# Patient Record
Sex: Female | Born: 1985 | Race: Black or African American | Hispanic: No | Marital: Single | Smoking: Never smoker
Health system: Southern US, Community
[De-identification: ages and names within clinical notes are randomized; demographics above are authoritative.]

---
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
## 2006-07-11 ENCOUNTER — Ambulatory Visit (HOSPITAL_COMMUNITY): Admission: RE | Admit: 2006-07-11 | Discharge: 2006-07-11 | Payer: Self-pay | Admitting: Unknown Physician Specialty

## 2006-08-08 ENCOUNTER — Ambulatory Visit (HOSPITAL_COMMUNITY): Admission: RE | Admit: 2006-08-08 | Discharge: 2006-08-08 | Payer: Self-pay | Admitting: Unknown Physician Specialty

## 2006-08-31 ENCOUNTER — Observation Stay (HOSPITAL_COMMUNITY): Admission: RE | Admit: 2006-08-31 | Discharge: 2006-08-31 | Payer: Self-pay | Admitting: Obstetrics and Gynecology

## 2006-09-05 ENCOUNTER — Ambulatory Visit (HOSPITAL_COMMUNITY): Admission: RE | Admit: 2006-09-05 | Discharge: 2006-09-05 | Payer: Self-pay | Admitting: Unknown Physician Specialty

## 2006-10-17 ENCOUNTER — Ambulatory Visit (HOSPITAL_COMMUNITY): Admission: RE | Admit: 2006-10-17 | Discharge: 2006-10-17 | Payer: Self-pay | Admitting: Unknown Physician Specialty

## 2007-07-23 IMAGING — US US OB FOLLOW-UP
1 series · 14 of 28 positions shown · non-contrast
Comparison: none

OBSTETRICAL ULTRASOUND:
 This ultrasound was performed in The [HOSPITAL], and the [facility] report will be stored to [REDACTED] PACS.

[Series 1: us ob follow-up · 14 of 37 slices shown]
[im 2/37]
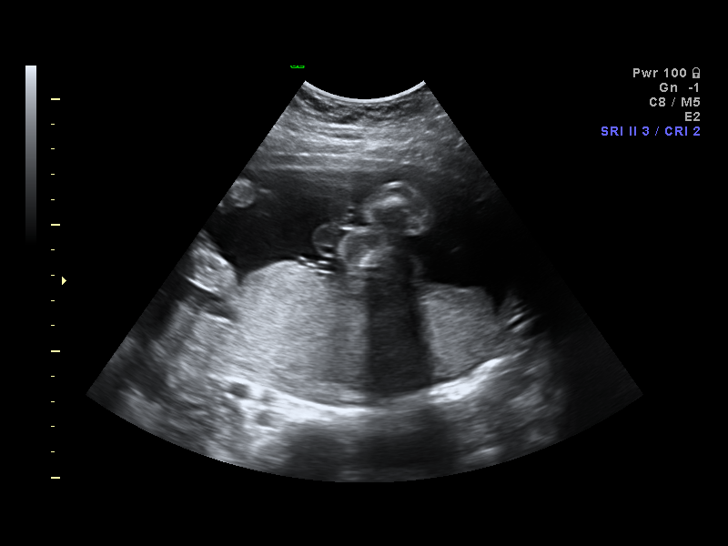
[im 5/37]
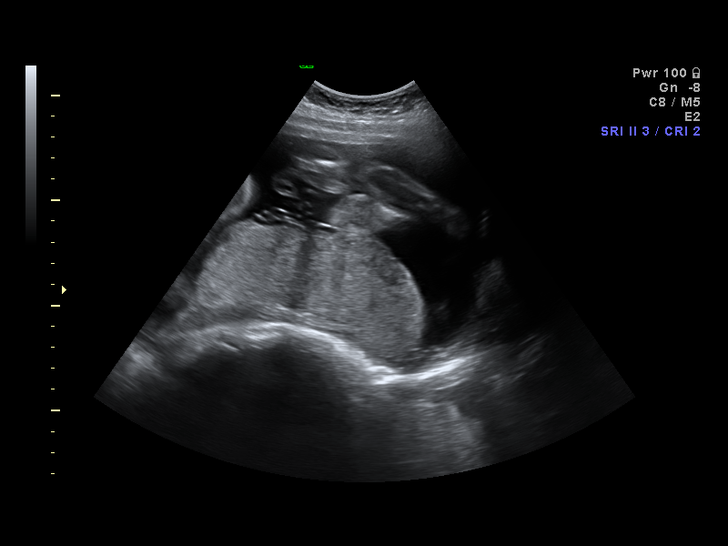
[im 7/37]
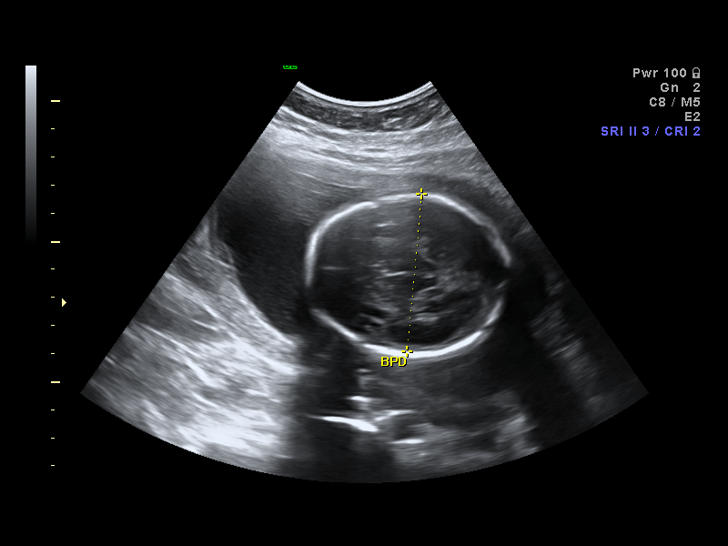
[im 10/37]
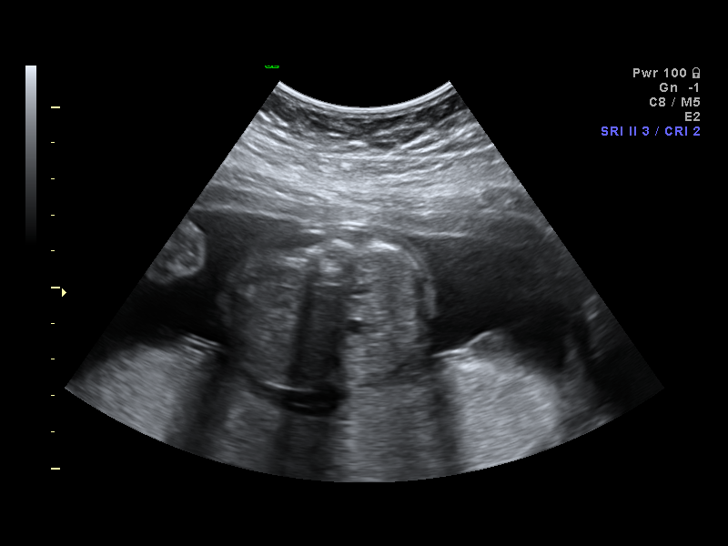
[im 13/37]
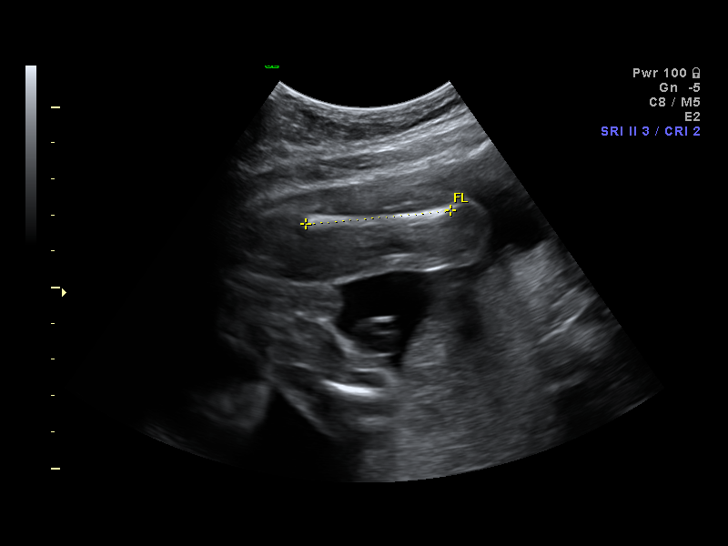
[im 15/37]
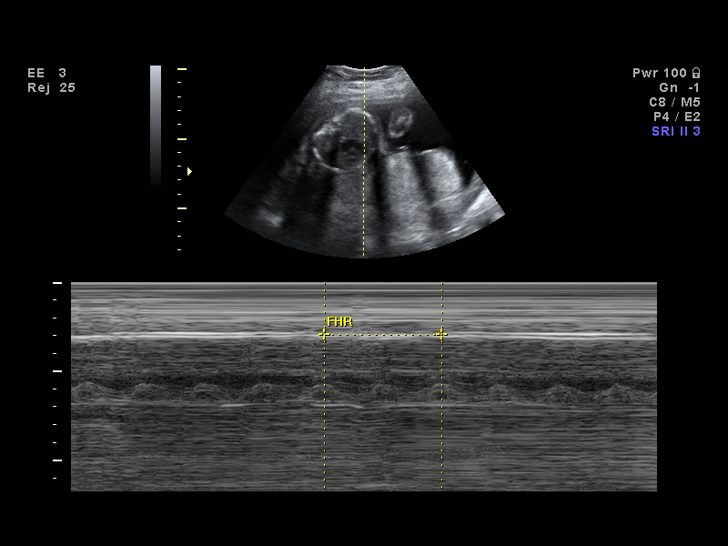
[im 18/37]
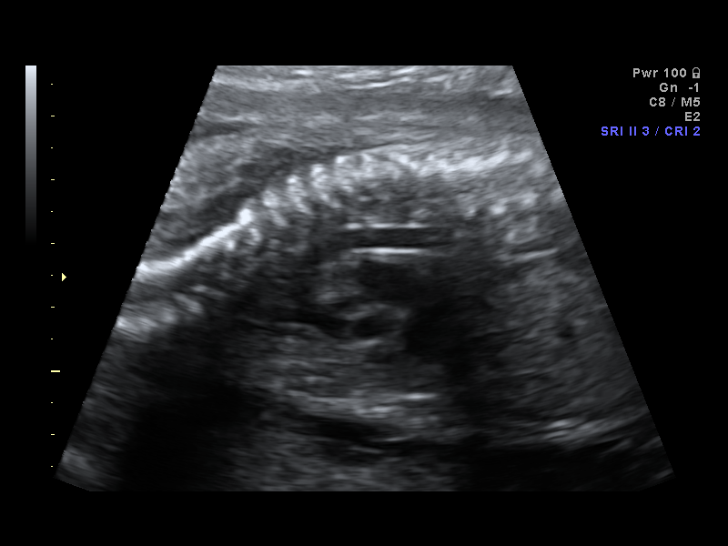
[im 21/37]
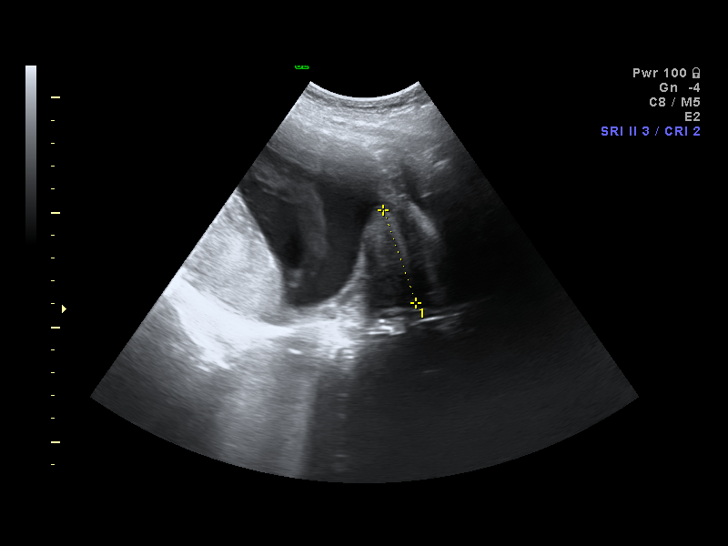
[im 23/37]
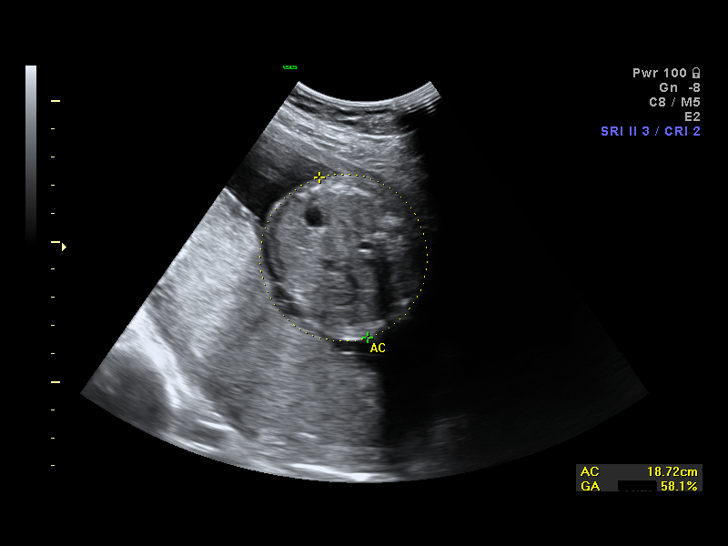
[im 26/37]
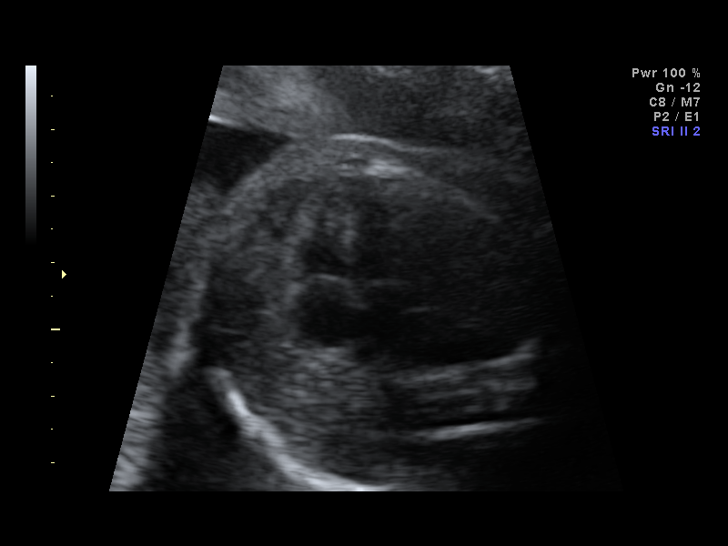
[im 29/37]
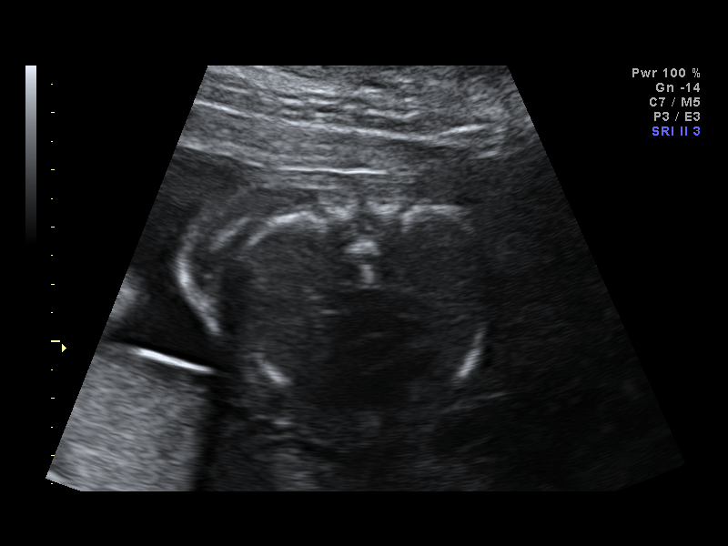
[im 31/37]
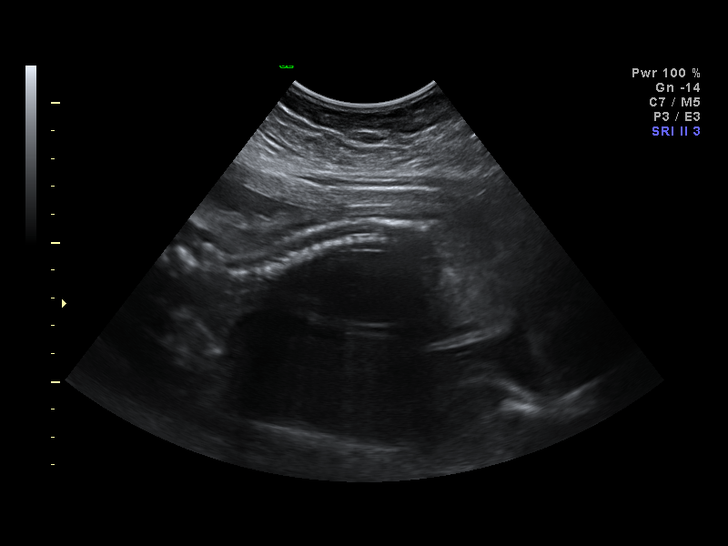
[im 34/37]
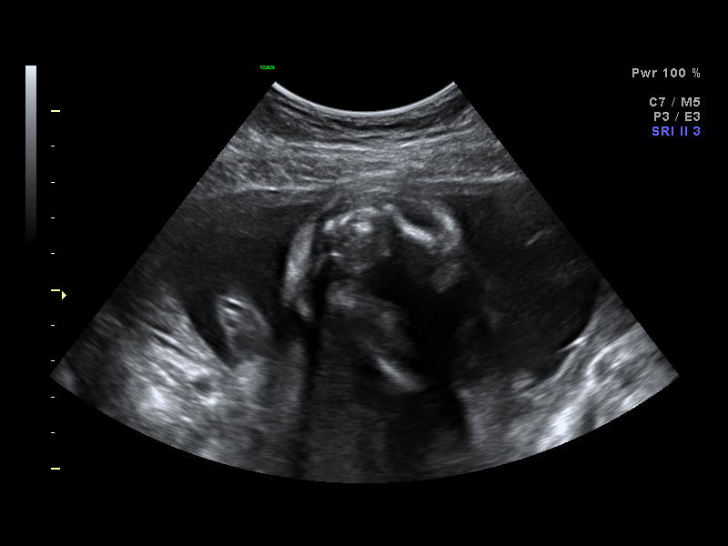
[im 37/37]
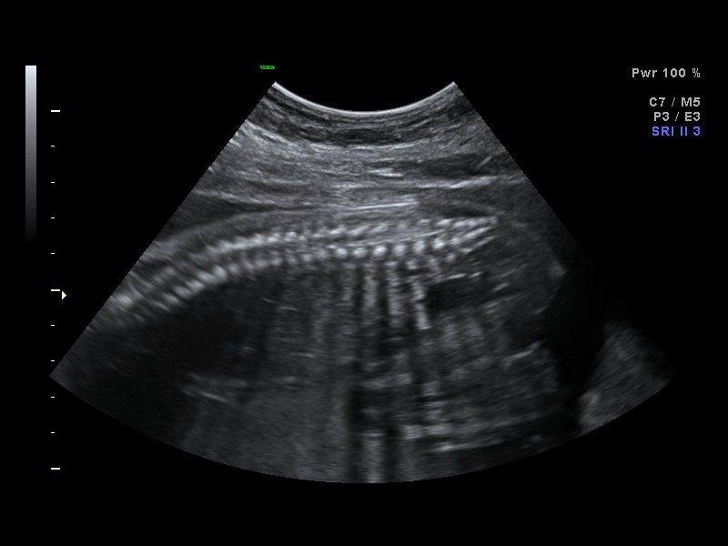

[14 of 28 positions shown; findings below may reference images not displayed]

IMPRESSION: The [facility] report has also been faxed to the ordering physician.

## 2009-01-31 ENCOUNTER — Emergency Department (HOSPITAL_COMMUNITY): Admission: EM | Admit: 2009-01-31 | Discharge: 2009-01-31 | Payer: Self-pay | Admitting: Emergency Medicine

## 2009-08-01 ENCOUNTER — Emergency Department (HOSPITAL_COMMUNITY): Admission: EM | Admit: 2009-08-01 | Discharge: 2009-08-01 | Payer: Self-pay | Admitting: Emergency Medicine

## 2010-07-21 LAB — POCT I-STAT, CHEM 8
BUN: 7 mg/dL (ref 6–23)
Calcium, Ion: 1.1 mmol/L — ABNORMAL LOW (ref 1.12–1.32)
Chloride: 107 meq/L (ref 96–112)
Creatinine, Ser: 0.8 mg/dL (ref 0.4–1.2)
Glucose, Bld: 90 mg/dL (ref 70–99)
HCT: 34 % — ABNORMAL LOW (ref 36.0–46.0)
Hemoglobin: 11.6 g/dL — ABNORMAL LOW (ref 12.0–15.0)
Potassium: 3.6 mEq/L (ref 3.5–5.1)
Sodium: 137 mEq/L (ref 135–145)
TCO2: 19 mmol/L (ref 0–100)

## 2010-07-21 LAB — PREGNANCY, URINE: Preg Test, Ur: POSITIVE

## 2010-07-21 LAB — HCG, QUANTITATIVE, PREGNANCY: hCG, Beta Chain, Quant, S: 216072 m[IU]/mL — ABNORMAL HIGH (ref <5)

## 2010-08-30 NOTE — Group Therapy Note (Signed)
NAMECAYLIN, NASS            ACCOUNT NO.:  [id number]   MEDICAL RECORD NO.:  [id number]          PATIENT TYPE:  OIB   LOCATION:  A415                          FACILITY:  [facility]   PHYSICIAN:  Lazaro Arms, M.D.   DATE OF BIRTH:  03-24-1986   DATE OF PROCEDURE:  08/31/2006  DATE OF DISCHARGE:                                 PROGRESS NOTE   SUBJECTIVE:  Sydney Chen is about [redacted] weeks pregnant with her first baby.  She is a patient of Dr. Ralph Dowdy and Dr. Mora Appl in [place].  She came in with  complaints of having seen some blood when she wiped this morning after  using the bathroom and she is having some mild contractions every 3-5  minutes that she does not feel.  She does have positive Trichomonas with  a very friable cervix.  Her cervix is long, thick, closed. At this  point, we will just treat her for her infection and keep an eye on her.  Recheck her cervix is 3-4 hours and just see where it goes.      Jacklyn Shell, C.N.M.      Lazaro Arms, M.D.  Electronically Signed    FC/MEDQ  D:  08/31/2006  T:  08/31/2006  Job:  [id number]   cc:   [facility]   Ernestina Penna  Fax: [phone]   Mora Appl, M.D.

## 2013-10-12 ENCOUNTER — Encounter (HOSPITAL_COMMUNITY): Payer: Self-pay | Admitting: Emergency Medicine

## 2013-10-12 ENCOUNTER — Emergency Department (HOSPITAL_COMMUNITY)
Admission: EM | Admit: 2013-10-12 | Discharge: 2013-10-12 | Disposition: A | Discharge status: Home / Self Care | Payer: BC Managed Care – HMO | Attending: Emergency Medicine | Admitting: Emergency Medicine

## 2013-10-12 DIAGNOSIS — K047 Periapical abscess without sinus: Secondary | ICD-10-CM | POA: Diagnosis not present

## 2013-10-12 DIAGNOSIS — K0889 Other specified disorders of teeth and supporting structures: Secondary | ICD-10-CM | POA: Diagnosis present

## 2013-10-12 DIAGNOSIS — Z792 Long term (current) use of antibiotics: Secondary | ICD-10-CM | POA: Diagnosis not present

## 2013-10-12 MED ORDER — HYDROCODONE-ACETAMINOPHEN 5-325 MG PO TABS: 1.0000 | ORAL_TABLET | ORAL | Status: AC | PRN

## 2013-10-12 MED ORDER — AMOXICILLIN 500 MG PO CAPS: 500.0000 mg | ORAL_CAPSULE | Freq: Three times a day (TID) | ORAL | Status: AC

## 2013-10-12 NOTE — ED Notes (Signed)
Left sided dental pain with swelling to left lower side of face.

## 2013-10-12 NOTE — Discharge Instructions (Signed)
Abscessed Tooth An abscessed tooth is an infection around your tooth. It may be caused by holes or damage to the tooth (cavity) or a dental disease. An abscessed tooth causes mild to very bad pain in and around the tooth. See your dentist right away if you have tooth or gum pain. HOME CARE  Take your medicine as told. Finish it even if you start to feel better.  Do not drive after taking pain medicine.  Rinse your mouth (gargle) often with salt water ( teaspoon salt in 8 ounces of warm water).  Do not apply heat to the outside of your face. GET HELP RIGHT AWAY IF:   You have a temperature by mouth above 102 F (38.9 C), not controlled by medicine.  You have chills and a very bad headache.  You have problems breathing or swallowing.  Your mouth will not open.  You develop puffiness (swelling) on the neck or around the eye.  Your pain is not helped by medicine.  Your pain is getting worse instead of better. MAKE SURE YOU:   Understand these instructions.  Will watch your condition.  Will get help right away if you are not doing well or get worse. Document Released: 09/20/2007 Document Revised: 06/26/2011 Document Reviewed: 07/12/2010 [facility] Patient Information 2015 [employer] [place]. This information is not intended to replace advice given to you by your health care provider. Make sure you discuss any questions you have with your health care provider.   Complete your entire course of antibiotics as prescribed.  You  may use the hydrocodone for pain relief but do not drive within 4 hours of taking as this will make you drowsy.  Avoid applying heat or ice to this abscess area which can worsen your symptoms.  You may use warm salt water swish and spit treatment or half peroxide and water swish and spit after meals to keep this area clean as discussed.  Call Dr. Raford PitcherBarrett for further management of your symptoms.

## 2013-10-12 NOTE — ED Provider Notes (Signed)
CSN: [id number]     Arrival date & time 10/12/13  1521 History  This chart was scribed for Sydney AmorJulie Idol, PA-C, non-physician practitioner working with Flint MelterElliott L Wentz, MD by Nicholos Johnsenise Iheanachor, ED scribe. This patient was seen in room APFT23/APFT23 and the patient's care was started at 3:59 PM.     Chief Complaint  Patient presents with  . Dental Pain   The history is provided by the patient. No language interpreter was used.   HPI Comments: Blase MessShaquana T Chen is a 28 y.o. female who presents to the Emergency Department complaining of left lower dental pain w/ associated swelling; onset last night. States the area of pain has had a broken tooth to the gumline for a while but pain has not been present until last night. Took Aleve last night for pain with minimal relief. Been using Orajel today that only relieves pain for a few minutes. Has not had wisdom teeth removed. Followed regularly by Dr. Raford PitcherBarrett and she has arranged to see him next week. 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  History reviewed. No pertinent past medical history. History reviewed. No pertinent past surgical history. No family history on file. History  Substance Use Topics  . Smoking status: Never Smoker   . Smokeless tobacco: Not on file  . Alcohol Use: Yes     Comment: occ   OB History   Grav Para Term Preterm Abortions TAB SAB Ect Mult Living                 Review of Systems  Constitutional: Negative for fever.  HENT: Positive for dental problem and facial swelling. Negative for sore throat.   Respiratory: Negative for shortness of breath.   Musculoskeletal: Negative for neck pain and neck stiffness.   Allergies  Review of patient's allergies indicates no known allergies.  Home Medications   Prior to Admission medications   Medication Sig Start Date End Date Taking? Authorizing Patrici Minnis  ibuprofen (ADVIL,MOTRIN) 200 MG tablet Take 400 mg by mouth once as needed for mild pain  or moderate pain.   Yes Historical Jovane Foutz, MD  amoxicillin (AMOXIL) 500 MG capsule Take 1 capsule (500 mg total) by mouth 3 (three) times daily. 10/12/13 10/22/13  Sydney AmorJulie Idol, PA-C  HYDROcodone-acetaminophen (NORCO/VICODIN) 5-325 MG per tablet Take 1 tablet by mouth every 4 (four) hours as needed. 10/12/13   Sydney AmorJulie Idol, PA-C   Triage vitals: BP 135/90  Pulse 78  Temp(Src) 97.6 F (36.4 C) (Oral)  Resp 18  Ht 5\' 6"  (1.676 m)  Wt 190 lb (86.183 kg)  BMI 30.68 kg/m2  SpO2 99%  Physical Exam  Nursing note and vitals reviewed. Constitutional: She is oriented to person, place, and time. She appears well-developed and well-nourished. No distress.  HENT:  Head: Normocephalic and atraumatic.  Right Ear: Tympanic membrane and external ear normal.  Left Ear: Tympanic membrane and external ear normal.  Mouth/Throat: Oropharynx is clear and moist and mucous membranes are normal. No oral lesions. No trismus in the jaw. No dental abscesses.  Left 1st molar is decayed and fractured to the gingival line. Mild gingival erythema without edema or fluctuance. Mild induration along outer jaw line with no fluctuance.   Eyes: Conjunctivae are normal.  Neck: Normal range of motion. Neck supple.  Cardiovascular: Normal rate and normal heart sounds.   No murmur heard. Lymphadenopathy:    She has no cervical adenopathy.  Neurological: She is alert and  oriented to person, place, and time.  Skin: Skin is warm and dry. No erythema.    ED Course  Procedures (including critical care time) DIAGNOSTIC STUDIES: Oxygen Saturation is 99% on room air, normal by my interpretation.    COORDINATION OF CARE: At 4:01 PM: Discussed treatment plan with patient which includes antibiotics and pain medication. Pt advised to follow up with her dentist. Patient agrees.    Labs Review Labs Reviewed - No data to display  Imaging Review No results found.   EKG Interpretation None      MDM   Final diagnoses:  Dental  abscess   Pt prescribed hydrocodone, amoxil.  Encouraged f/u with dentist as planned. The patient appears reasonably screened and/or stabilized for discharge and I doubt any other medical condition or other [facility] requiring further screening, evaluation, or treatment in the ED at this time prior to discharge.  I personally performed the services described in this documentation, which was scribed in my presence. The recorded information has been reviewed and is accurate.     Sydney AmorJulie Idol, PA-C 10/14/13 1240

## 2013-10-15 NOTE — ED Provider Notes (Signed)
Medical screening examination/treatment/procedure(s) were performed by non-physician practitioner and as supervising physician I was immediately available for consultation/collaboration.  Iman Orourke L Cyera Balboni, MD 10/15/13 1559 

## 2020-02-27 ENCOUNTER — Ambulatory Visit
Admission: RE | Admit: 2020-02-27 | Discharge: 2020-02-27 | Disposition: A | Discharge status: Home / Self Care | Payer: PRIVATE HEALTH INSURANCE | Source: Ambulatory Visit | Attending: Emergency Medicine | Admitting: Emergency Medicine

## 2020-02-27 ENCOUNTER — Other Ambulatory Visit: Payer: Self-pay

## 2020-02-27 VITALS — BP 128/80 | HR 90 | Temp 98.5°F | Resp 19 | Ht 66.0 in | Wt 180.0 lb

## 2020-02-27 DIAGNOSIS — G43809 Other migraine, not intractable, without status migrainosus: Secondary | ICD-10-CM

## 2020-02-27 MED ORDER — KETOROLAC TROMETHAMINE 30 MG/ML IJ SOLN
30.0000 mg | Freq: Once | INTRAMUSCULAR | Status: AC
  Administered 2020-02-27: 30 mg via INTRAMUSCULAR

## 2020-02-27 MED ORDER — DEXAMETHASONE SODIUM PHOSPHATE 10 MG/ML IJ SOLN
10.0000 mg | Freq: Once | INTRAMUSCULAR | Status: AC
  Administered 2020-02-27: 10 mg via INTRAMUSCULAR

## 2020-02-27 NOTE — ED Triage Notes (Signed)
Headache  x  3 days.  Pt states she works from home but was unable to work today.  Hx of headaches. Has tried Excedrin with no relief.

## 2020-02-27 NOTE — ED Provider Notes (Signed)
[facility]   [id number] 02/27/20 Arrival Time: 1141  [id number]  SUBJECTIVE:  Sydney Chen is a 34 y.o. female who complains of migraine for 3 days.  Denies a precipitating event, or recent head trauma.  Patient localizes her pain to the temples and forehead.  Describes the pain as constant and throbbing in character.  Patient has tried OTC excedrin without relief. Symptoms are made worse with light and wearing a hat.  Reports similar symptoms in the past.  This is not the worst headache of their life.  Patient denies fever, chills, nausea, vomiting, rhinorrhea, watery eyes, chest pain, SOB, abdominal pain, weakness, numbness or tingling, slurred speech.     ROS: As per HPI.  All other pertinent ROS negative.     History reviewed. No pertinent past medical history. History reviewed. No pertinent surgical history. No Known Allergies No current facility-administered medications on file prior to encounter.   Current Outpatient Medications on File Prior to Encounter  Medication Sig Dispense Refill  . HYDROcodone-acetaminophen (NORCO/VICODIN) 5-325 MG per tablet Take 1 tablet by mouth every 4 (four) hours as needed. 15 tablet 0  . ibuprofen (ADVIL,MOTRIN) 200 MG tablet Take 400 mg by mouth once as needed for mild pain or moderate pain.     Social History   Socioeconomic History  . Marital status: Single    Spouse name: Not on file  . Number of children: Not on file  . Years of education: Not on file  . Highest education level: Not on file  Occupational History  . Not on file  Tobacco Use  . Smoking status: Never Smoker  . Smokeless tobacco: Never Used  Substance and Sexual Activity  . Alcohol use: Yes    Comment: occ  . Drug use: No  . Sexual activity: Yes    Birth control/protection: Implant  Other Topics Concern  . Not on file  Social History Narrative  . Not on file   Social Determinants of Health   Financial Resource Strain:   . Difficulty of  Paying Living Expenses: Not on file  Food Insecurity:   . Worried About Programme researcher, broadcasting/film/video in the Last Year: Not on file  . Ran Out of Food in the Last Year: Not on file  Transportation Needs:   . Lack of Transportation (Medical): Not on file  . Lack of Transportation (Non-Medical): Not on file  Physical Activity:   . Days of Exercise per Week: Not on file  . Minutes of Exercise per Session: Not on file  Stress:   . Feeling of Stress : Not on file  Social Connections:   . Frequency of Communication with Friends and Family: Not on file  . Frequency of Social Gatherings with Friends and Family: Not on file  . Attends Religious Services: Not on file  . Active Member of Clubs or Organizations: Not on file  . Attends Banker Meetings: Not on file  . Marital Status: Not on file  Intimate Partner Violence:   . Fear of Current or Ex-Partner: Not on file  . Emotionally Abused: Not on file  . Physically Abused: Not on file  . Sexually Abused: Not on file   No family history on file.  OBJECTIVE:  Vitals:   02/27/20 1159 02/27/20 1200  BP: 128/80   Pulse: 90   Resp: 19   Temp: 98.5 F (36.9 C)   TempSrc: Oral   SpO2: 98%   Weight:  180 lb (81.6 kg)  Height:  5\' 6"  (1.676 m)    General appearance: alert; no distress Eyes: PERRLA; EOMI HENT: normocephalic; atraumatic Neck: supple with FROM Lungs: clear to auscultation bilaterally Heart: regular rate and rhythm.   Extremities: no edema; symmetrical with no gross deformities Skin: warm and dry Neurologic: CN 2-12 grossly intact; finger to nose without difficulty; normal gait; strength and sensation intact bilaterally about the upper and lower extremities; negative pronator drift Psychological: alert and cooperative; normal mood and affect   ASSESSMENT & PLAN:  1. Other migraine without status migrainosus, not intractable     Meds ordered this encounter  Medications  . ketorolac (TORADOL) 30 MG/ML injection 30  mg  . dexamethasone (DECADRON) injection 10 mg    Migraine cocktail given in office Rest and drink plenty of fluids Use OTC medications as needed for symptomatic relief Follow up with PCP if symptoms persists Return or go to the ER if you have any new or worsening symptoms such as fever, chills, nausea, vomiting, chest pain, shortness of breath, cough, vision changes, worsening headache despite treatment, slurred speech, facial asymmetry, weakness in arms or legs, etc...  Reviewed expectations re: course of current medical issues. Questions answered. Outlined signs and symptoms indicating need for more acute intervention. Patient verbalized understanding. After Visit Summary given.   , PA-C 02/27/20 1215

## 2020-02-27 NOTE — Discharge Instructions (Signed)
Migraine cocktail given in office Rest and drink plenty of fluids Use OTC medications as needed for symptomatic relief Follow up with PCP if symptoms persists Return or go to the ER if you have any new or worsening symptoms such as fever, chills, nausea, vomiting, chest pain, shortness of breath, cough, vision changes, worsening headache despite treatment, slurred speech, facial asymmetry, weakness in arms or legs, etc... 

## 2021-11-04 ENCOUNTER — Ambulatory Visit
Admission: EM | Admit: 2021-11-04 | Discharge: 2021-11-04 | Disposition: A | Discharge status: Home / Self Care | Payer: BC Managed Care – PPO | Attending: Nurse Practitioner | Admitting: Nurse Practitioner

## 2021-11-04 ENCOUNTER — Other Ambulatory Visit: Payer: Self-pay

## 2021-11-04 ENCOUNTER — Encounter: Payer: Self-pay | Admitting: Emergency Medicine

## 2021-11-04 DIAGNOSIS — L02415 Cutaneous abscess of right lower limb: Secondary | ICD-10-CM

## 2021-11-04 MED ORDER — DOXYCYCLINE HYCLATE 100 MG PO TABS: 100.0000 mg | ORAL_TABLET | Freq: Two times a day (BID) | ORAL | 0 refills | Status: AC

## 2021-11-04 NOTE — ED Triage Notes (Addendum)
Pt reports "glass-like" pain in posterior right thigh last Thursday. Pt reports noticed drainage from back of leg on Monday and reports appeared "more-like a mosquito bite" but reports has progressively gotten worse ever since.

## 2021-11-04 NOTE — Discharge Instructions (Addendum)
Take medication as prescribed. Continue use of warm compresses to the right thigh at least 3-4 times daily while symptoms persist. Cleanse the area at least twice daily with an antibacterial soap such as Dial Gold bar soap. May take over-the-counter ibuprofen or Tylenol as needed for pain or discomfort. Go to the emergency department immediately if you develop fever, chills, chest pain, shortness of breath, generalized fatigue, worsening redness or streaking along the right thigh, or foul-smelling drainage. Follow-up in this clinic if symptoms do not improve within the next 4 to 5 days.

## 2021-11-04 NOTE — ED Notes (Signed)
Bacitracin applied to site. Telfa applied over ointment. Secured in place with perforated tape.   Site management and infection prevention education provided. Pt verbalized understanding.

## 2021-11-04 NOTE — ED Provider Notes (Signed)
[facility]    CSN: [id number] Arrival date & time: 11/04/21  1816      History   Chief Complaint Chief Complaint  Patient presents with  . Abscess    HPI Sydney Chen is a 36 y.o. female.   The history is provided by the patient.   Patient presents for complaints of abscess to the right inner thigh that have been present for the past week.  Patient states area increased in size and has started draining over the past 24 to 48 hours.  She states the area "looks better today".  She states that she does have improving redness along the inner aspect of the thigh.  She denies fever, chills, chest pain, abdominal pain, nausea, vomiting, or diarrhea.  She denies previous history of abscess.  She thinks that symptoms may have started with an insect bite.  She has not taken any medication for her symptoms.  Patient reports that she does not want the area drained today.  History reviewed. No pertinent past medical history.  There are no problems to display for this patient.   History reviewed. No pertinent surgical history.  OB History   No obstetric history on file.      Home Medications    Prior to Admission medications   Medication Sig Start Date End Date Taking? Authorizing Provider  doxycycline (VIBRA-TABS) 100 MG tablet Take 1 tablet (100 mg total) by mouth 2 (two) times daily. 11/04/21  Yes Azaria Bartell-Warren, Sadie Haber, NP  HYDROcodone-acetaminophen (NORCO/VICODIN) 5-325 MG per tablet Take 1 tablet by mouth every 4 (four) hours as needed. 10/12/13   Burgess Amor, PA-C  ibuprofen (ADVIL,MOTRIN) 200 MG tablet Take 400 mg by mouth once as needed for mild pain or moderate pain.    [provider]    Family History History reviewed. No pertinent family history.  Social History Social History   Tobacco Use  . Smoking status: Never  . Smokeless tobacco: Never  Substance Use Topics  . Alcohol use: Yes    Comment: occ  . Drug use: No     Allergies    Patient has no known allergies.   Review of Systems Review of Systems Per HPI  Physical Exam Triage Vital Signs ED Triage Vitals [11/04/21 1850]  Enc Vitals Group     BP (!) 148/88     Pulse Rate 82     Resp 18     Temp 97.9 F (36.6 C)     Temp Source Oral     SpO2 100 %     Weight      Height      Head Circumference      Peak Flow      Pain Score 6     Pain Loc      Pain Edu?      Excl. in GC?    No data found.  Updated Vital Signs BP (!) 148/88 (BP Location: Right Arm)   Pulse 82   Temp 97.9 F (36.6 C) (Oral)   Resp 18   LMP 11/04/2021   SpO2 100%   Visual Acuity Right Eye Distance:   Left Eye Distance:   Bilateral Distance:    Right Eye Near:   Left Eye Near:    Bilateral Near:     Physical Exam Vitals and nursing note reviewed.  Constitutional:      General: She is not in acute distress.    Appearance: Normal appearance.  HENT:  Head: Normocephalic.  Eyes:     Pupils: Pupils are equal, round, and reactive to light.  Cardiovascular:     Rate and Rhythm: Normal rate and regular rhythm.     Pulses: Normal pulses.     Heart sounds: Normal heart sounds.  Pulmonary:     Effort: Pulmonary effort is normal.     Breath sounds: Normal breath sounds.  Abdominal:     General: Bowel sounds are normal.     Palpations: Abdomen is soft.  Skin:    General: Skin is warm and dry.     Findings: Abscess present.          Comments: Abscess noted to the inner aspect of the right thigh.  Area is fluctuant and is actively draining at this time.  Able to express a small amount of pus appearing drainage from the area.  Neurological:     General: No focal deficit present.     Mental Status: She is alert and oriented to person, place, and time.  Psychiatric:        Mood and Affect: Mood normal.        Behavior: Behavior normal.     UC Treatments / Results  Labs (all labs ordered are listed, but only abnormal results are displayed) Labs Reviewed - No  data to display  EKG   Radiology No results found.  Procedures Procedures (including critical care time)  Medications Ordered in UC Medications - No data to display  Initial Impression / Assessment and Plan / UC Course  I have reviewed the triage vital signs and the nursing notes.  Pertinent labs & imaging results that were available during my care of the patient were reviewed by me and considered in my medical decision making (see chart for details).  Patient presents with abscess to the right inner thigh that has been present for the past week.  On exam, abscess is located to the right inner thigh, area is actively draining and is fluctuant.  Patient declines drainage of the abscess today.  She would like to start antibiotics to see if this helps her symptoms.  We will start patient on doxycycline for 10 days.  Supportive care recommendations were provided to the patient with strict indications of when to follow-up in this clinic versus when to go to the emergency department. Final Clinical Impressions(s) / UC Diagnoses   Final diagnoses:  Abscess of right thigh     Discharge Instructions      Take medication as prescribed. Continue use of warm compresses to the right thigh at least 3-4 times daily while symptoms persist. Cleanse the area at least twice daily with an antibacterial soap such as Dial Gold bar soap. May take over-the-counter ibuprofen or Tylenol as needed for pain or discomfort. Go to the emergency department immediately if you develop fever, chills, chest pain, shortness of breath, generalized fatigue, worsening redness or streaking along the right thigh, or foul-smelling drainage. Follow-up in this clinic if symptoms do not improve within the next 4 to 5 days.     ED Prescriptions     Medication Sig Dispense Auth. Provider   doxycycline (VIBRA-TABS) 100 MG tablet Take 1 tablet (100 mg total) by mouth 2 (two) times daily. 20 tablet Helios Kohlmann-Warren, Sadie Haber,  NP      PDMP not reviewed this encounter.   Abran Cantor, NP 11/04/21 2013

## 2021-12-14 ENCOUNTER — Encounter: Payer: Self-pay | Admitting: Emergency Medicine

## 2021-12-14 ENCOUNTER — Ambulatory Visit
Admission: EM | Admit: 2021-12-14 | Discharge: 2021-12-14 | Disposition: A | Discharge status: Home / Self Care | Payer: BC Managed Care – PPO | Attending: Family Medicine | Admitting: Family Medicine

## 2021-12-14 DIAGNOSIS — L0291 Cutaneous abscess, unspecified: Secondary | ICD-10-CM

## 2021-12-14 MED ORDER — SULFAMETHOXAZOLE-TRIMETHOPRIM 800-160 MG PO TABS: 1.0000 | ORAL_TABLET | Freq: Two times a day (BID) | ORAL | 0 refills | Status: AC

## 2021-12-14 MED ORDER — CHLORHEXIDINE GLUCONATE 4 % EX LIQD: Freq: Every day | CUTANEOUS | 0 refills | Status: AC | PRN

## 2021-12-14 NOTE — ED Provider Notes (Signed)
[facility]    CSN: [id number] Arrival date & time: 12/14/21  1913      History   Chief Complaint No chief complaint on file.   HPI Sydney Chen is a 36 y.o. female.   Patient here today with a red, swollen, painful area to the left upper thigh that she first noticed about 2 days ago.  She states a small pustule is coming up and she is worried about the area getting worse as she recently had a large abscess to the other thigh.  She has been applying warm compresses with minimal relief.  Denies bleeding, drainage, fevers, chills.    History reviewed. No pertinent past medical history.  There are no problems to display for this patient.   History reviewed. No pertinent surgical history.  OB History   No obstetric history on file.      Home Medications    Prior to Admission medications   Medication Sig Start Date End Date Taking? Authorizing Provider  chlorhexidine (HIBICLENS) 4 % external liquid Apply topically daily as needed. 12/14/21  Yes Particia Nearing, PA-C  sulfamethoxazole-trimethoprim (BACTRIM DS) 800-160 MG tablet Take 1 tablet by mouth 2 (two) times daily for 7 days. 12/14/21 12/21/21 Yes Particia Nearing, PA-C  doxycycline (VIBRA-TABS) 100 MG tablet Take 1 tablet (100 mg total) by mouth 2 (two) times daily. 11/04/21   Leath-Warren, Sadie Haber, NP  HYDROcodone-acetaminophen (NORCO/VICODIN) 5-325 MG per tablet Take 1 tablet by mouth every 4 (four) hours as needed. 10/12/13   Burgess Amor, PA-C  ibuprofen (ADVIL,MOTRIN) 200 MG tablet Take 400 mg by mouth once as needed for mild pain or moderate pain.    [provider]    Family History History reviewed. No pertinent family history.  Social History Social History   Tobacco Use   Smoking status: Never   Smokeless tobacco: Never  Substance Use Topics   Alcohol use: Yes    Comment: occ   Drug use: No     Allergies   Patient has no known allergies.   Review of  Systems Review of Systems Per HPI  Physical Exam Triage Vital Signs ED Triage Vitals  Enc Vitals Group     BP 12/14/21 1922 124/79     Pulse Rate 12/14/21 1922 84     Resp 12/14/21 1922 16     Temp 12/14/21 1922 98.4 F (36.9 C)     Temp Source 12/14/21 1922 Oral     SpO2 12/14/21 1922 96 %     Weight --      Height --      Head Circumference --      Peak Flow --      Pain Score 12/14/21 1923 6     Pain Loc --      Pain Edu? --      Excl. in GC? --    No data found.  Updated Vital Signs BP 124/79 (BP Location: Right Arm)   Pulse 84   Temp 98.4 F (36.9 C) (Oral)   Resp 16   LMP 12/14/2021 (Exact Date)   SpO2 96%   Visual Acuity Right Eye Distance:   Left Eye Distance:   Bilateral Distance:    Right Eye Near:   Left Eye Near:    Bilateral Near:     Physical Exam Vitals and nursing note reviewed.  Constitutional:      Appearance: Normal appearance. She is not ill-appearing.  HENT:  Head: Atraumatic.  Eyes:     Extraocular Movements: Extraocular movements intact.     Conjunctiva/sclera: Conjunctivae normal.  Cardiovascular:     Rate and Rhythm: Normal rate and regular rhythm.     Heart sounds: Normal heart sounds.  Pulmonary:     Effort: Pulmonary effort is normal.     Breath sounds: Normal breath sounds.  Musculoskeletal:        General: Normal range of motion.     Cervical back: Normal range of motion and neck supple.  Skin:    General: Skin is warm and dry.     Comments: Area of erythema, tenderness to palpation with small central pustule left medial upper leg.  No fluctuance, induration, drainage  Neurological:     Mental Status: She is alert and oriented to person, place, and time.  Psychiatric:        Mood and Affect: Mood normal.        Thought Content: Thought content normal.        Judgment: Judgment normal.      UC Treatments / Results  Labs (all labs ordered are listed, but only abnormal results are displayed) Labs Reviewed - No  data to display  EKG   Radiology No results found.  Procedures Procedures (including critical care time)  Medications Ordered in UC Medications - No data to display  Initial Impression / Assessment and Plan / UC Course  I have reviewed the triage vital signs and the nursing notes.  Pertinent labs & imaging results that were available during my care of the patient were reviewed by me and considered in my medical decision making (see chart for details).     No indication for I&D today, will start Bactrim, Hibiclens, warm compresses and monitor for improvement.  Return for worsening symptoms.  Final Clinical Impressions(s) / UC Diagnoses   Final diagnoses:  Abscess   Discharge Instructions   None    ED Prescriptions     Medication Sig Dispense Auth. Provider   sulfamethoxazole-trimethoprim (BACTRIM DS) 800-160 MG tablet Take 1 tablet by mouth 2 (two) times daily for 7 days. 14 tablet Particia Nearing, [place]   chlorhexidine (HIBICLENS) 4 % external liquid Apply topically daily as needed. 120 mL Particia Nearing, [place]      PDMP not reviewed this encounter.   Particia Nearing, [place] 12/14/21 1936

## 2021-12-14 NOTE — ED Triage Notes (Signed)
Boil in inside of left thigh since Tuesday night.

## 2022-01-04 ENCOUNTER — Encounter (HOSPITAL_COMMUNITY): Payer: Self-pay

## 2022-01-04 ENCOUNTER — Emergency Department (HOSPITAL_COMMUNITY)
Admission: EM | Admit: 2022-01-04 | Discharge: 2022-01-04 | Disposition: A | Discharge status: Home / Self Care | Payer: BC Managed Care – PPO | Attending: Emergency Medicine | Admitting: Emergency Medicine

## 2022-01-04 DIAGNOSIS — R42 Dizziness and giddiness: Secondary | ICD-10-CM | POA: Insufficient documentation

## 2022-01-04 DIAGNOSIS — G4489 Other headache syndrome: Secondary | ICD-10-CM | POA: Diagnosis not present

## 2022-01-04 DIAGNOSIS — R0902 Hypoxemia: Secondary | ICD-10-CM | POA: Diagnosis not present

## 2022-01-04 DIAGNOSIS — R55 Syncope and collapse: Secondary | ICD-10-CM | POA: Insufficient documentation

## 2022-01-04 DIAGNOSIS — R52 Pain, unspecified: Secondary | ICD-10-CM | POA: Diagnosis not present

## 2022-01-04 LAB — CBC WITH DIFFERENTIAL/PLATELET
Abs Immature Granulocytes: 0.03 10*3/uL (ref 0.00–0.07)
Basophils Absolute: 0 10*3/uL (ref 0.0–0.1)
Basophils Relative: 0 %
Eosinophils Absolute: 0.2 10*3/uL (ref 0.0–0.5)
Eosinophils Relative: 3 %
HCT: 39.3 % (ref 36.0–46.0)
Hemoglobin: 12.1 g/dL (ref 12.0–15.0)
Immature Granulocytes: 1 %
Lymphocytes Relative: 35 %
Lymphs Abs: 2.2 10*3/uL (ref 0.7–4.0)
MCH: 22 pg — ABNORMAL LOW (ref 26.0–34.0)
MCHC: 30.8 g/dL (ref 30.0–36.0)
MCV: 71.3 fL — ABNORMAL LOW (ref 80.0–100.0)
Monocytes Absolute: 0.4 10*3/uL (ref 0.1–1.0)
Monocytes Relative: 7 %
Neutro Abs: 3.4 10*3/uL (ref 1.7–7.7)
Neutrophils Relative %: 54 %
Platelets: 279 10*3/uL (ref 150–400)
RBC: 5.51 MIL/uL — ABNORMAL HIGH (ref 3.87–5.11)
RDW: 14.9 % (ref 11.5–15.5)
WBC: 6.2 10*3/uL (ref 4.0–10.5)
nRBC: 0 % (ref 0.0–0.2)

## 2022-01-04 LAB — URINALYSIS, ROUTINE W REFLEX MICROSCOPIC
Bacteria, UA: NONE SEEN
Bilirubin Urine: NEGATIVE
Glucose, UA: NEGATIVE mg/dL
Hgb urine dipstick: NEGATIVE
Ketones, ur: NEGATIVE mg/dL
Nitrite: NEGATIVE
Protein, ur: NEGATIVE mg/dL
Specific Gravity, Urine: 1.006 (ref 1.005–1.030)
pH: 7 (ref 5.0–8.0)

## 2022-01-04 LAB — COMPREHENSIVE METABOLIC PANEL
ALT: 15 U/L (ref 0–44)
AST: 20 U/L (ref 15–41)
Albumin: 4.4 g/dL (ref 3.5–5.0)
Alkaline Phosphatase: 52 U/L (ref 38–126)
Anion gap: 9 (ref 5–15)
BUN: 7 mg/dL (ref 6–20)
CO2: 23 mmol/L (ref 22–32)
Calcium: 9.3 mg/dL (ref 8.9–10.3)
Chloride: 105 mmol/L (ref 98–111)
Creatinine, Ser: 0.83 mg/dL (ref 0.44–1.00)
GFR, Estimated: >60 mL/min (ref >60)
Glucose, Bld: 97 mg/dL (ref 70–99)
Potassium: 3.7 mmol/L (ref 3.5–5.1)
Sodium: 137 mmol/L (ref 135–145)
Total Bilirubin: 0.8 mg/dL (ref 0.3–1.2)
Total Protein: 8 g/dL (ref 6.5–8.1)

## 2022-01-04 LAB — POC URINE PREG, ED: Preg Test, Ur: NEGATIVE

## 2022-01-04 LAB — CBG MONITORING, ED: Glucose-Capillary: 136 mg/dL — ABNORMAL HIGH (ref 70–99)

## 2022-01-04 MED ORDER — SODIUM CHLORIDE 0.9% FLUSH
3.0000 mL | Freq: Once | INTRAVENOUS | Status: AC
  Administered 2022-01-04: 3 mL via INTRAVENOUS

## 2022-01-04 NOTE — ED Notes (Signed)
Pt. Still unable to urinate 

## 2022-01-04 NOTE — Discharge Instructions (Signed)
Work-up here without any acute findings.  Work note provided.  Rest for the next 2 days.  Return for any new or worse symptoms or any recurrent passing out.  This would require greater work-up.

## 2022-01-04 NOTE — ED Notes (Signed)
Pt. Alert and oriented. Steady gait. Ambulatory on d/c.

## 2022-01-04 NOTE — ED Triage Notes (Addendum)
BIB by [facility]. Syncopal episode at work this am. Pt. Was brought down to floor by coworkers, no harm to the pt noted. Pt. Notes she was unconscious for "less than a minute". Pt. States "my head just feels heavy." Pt. Also notes she had not ate since last night. Pt. Is alert and oriented.

## 2022-01-04 NOTE — ED Provider Notes (Addendum)
[facility] EMERGENCY DEPARTMENT Provider Note   CSN: [id number] Arrival date & time: 01/04/22  1941     History  Chief Complaint  Patient presents with   Loss of Consciousness    Syncope episode     Sydney Chen is a 36 y.o. female.  Patient brought in by EMS.  Patient was at work and shortly prior to arrival here had a syncopal episode.  Called by work members no significant fall no injury.  Patient stated she started to feel dizzy and the vision started to gray and she is kind of feel herself going down.  Patient states she felt fine yesterday and felt fine prior to work.  Feels fine currently.  Past medical history noncontributory patient's not a tobacco user.       Home Medications Prior to Admission medications   Medication Sig Start Date End Date Taking? Authorizing Provider  chlorhexidine (HIBICLENS) 4 % external liquid Apply topically daily as needed. Patient not taking: Reported on 01/04/2022 12/14/21   Volney American, PA-C  doxycycline (VIBRA-TABS) 100 MG tablet Take 1 tablet (100 mg total) by mouth 2 (two) times daily. Patient not taking: Reported on 01/04/2022 11/04/21   Leath-Warren, Alda Lea, NP  HYDROcodone-acetaminophen (NORCO/VICODIN) 5-325 MG per tablet Take 1 tablet by mouth every 4 (four) hours as needed. Patient not taking: Reported on 01/04/2022 10/12/13   Evalee Jefferson, PA-C  ibuprofen (ADVIL,MOTRIN) 200 MG tablet Take 400 mg by mouth once as needed for mild pain or moderate pain. Patient not taking: Reported on 01/04/2022    [provider]      Allergies    Patient has no known allergies.    Review of Systems   Review of Systems  Constitutional:  Negative for chills and fever.  HENT:  Negative for ear pain and sore throat.   Eyes:  Negative for pain and visual disturbance.  Respiratory:  Negative for cough and shortness of breath.   Cardiovascular:  Negative for chest pain and palpitations.  Gastrointestinal:  Negative for  abdominal pain and vomiting.  Genitourinary:  Negative for dysuria and hematuria.  Musculoskeletal:  Negative for arthralgias and back pain.  Skin:  Negative for color change and rash.  Neurological:  Positive for syncope. Negative for seizures.  All other systems reviewed and are negative.   Physical Exam Updated Vital Signs BP 123/79 (BP Location: Right Arm)   Pulse 63   Temp 98.2 F (36.8 C) (Oral)   Resp 17   Ht 1.676 m (5\' 6" )   Wt 83.9 kg   LMP 12/14/2021 (Approximate)   SpO2 100%   BMI 29.86 kg/m  Physical Exam Vitals and nursing note reviewed.  Constitutional:      General: She is not in acute distress.    Appearance: Normal appearance. She is well-developed. She is not ill-appearing.  HENT:     Head: Normocephalic and atraumatic.     Mouth/Throat:     Mouth: Mucous membranes are moist.  Eyes:     Extraocular Movements: Extraocular movements intact.     Conjunctiva/sclera: Conjunctivae normal.     Pupils: Pupils are equal, round, and reactive to light.  Cardiovascular:     Rate and Rhythm: Normal rate and regular rhythm.     Heart sounds: No murmur heard. Pulmonary:     Effort: Pulmonary effort is normal. No respiratory distress.     Breath sounds: Normal breath sounds.  Abdominal:     Palpations: Abdomen is soft.  Tenderness: There is no abdominal tenderness. There is no guarding.  Musculoskeletal:        General: No swelling.     Cervical back: Normal range of motion and neck supple. No rigidity or tenderness.  Skin:    General: Skin is warm and dry.     Capillary Refill: Capillary refill takes less than 2 seconds.  Neurological:     General: No focal deficit present.     Mental Status: She is alert and oriented to person, place, and time.     Cranial Nerves: No cranial nerve deficit.     Sensory: No sensory deficit.     Motor: No weakness.  Psychiatric:        Mood and Affect: Mood normal.     ED Results / Procedures / Treatments   Labs (all  labs ordered are listed, but only abnormal results are displayed) Labs Reviewed  CBC WITH DIFFERENTIAL/PLATELET - Abnormal; Notable for the following components:      Result Value   RBC 5.51 (*)    MCV 71.3 (*)    MCH 22.0 (*)    All other components within normal limits  CBG MONITORING, ED - Abnormal; Notable for the following components:   Glucose-Capillary 136 (*)    All other components within normal limits  URINALYSIS, ROUTINE W REFLEX MICROSCOPIC  COMPREHENSIVE METABOLIC PANEL  POC URINE PREG, ED    EKG EKG Interpretation  Date/Time:  Wednesday January 04 2022 08:13:54 EDT Ventricular Rate:  75 PR Interval:  154 QRS Duration: 102 QT Interval:  384 QTC Calculation: 429 R Axis:   39 Text Interpretation: Sinus rhythm Confirmed by Vanetta Mulders [phone]) on 01/04/2022 8:50:00 AM  Radiology No results found.  Procedures Procedures    Medications Ordered in ED Medications  sodium chloride flush (NS) 0.9 % injection 3 mL (3 mLs Intravenous Given 01/04/22 0844)    ED Course/ Medical Decision Making/ A&P                           Medical Decision Making Amount and/or Complexity of Data Reviewed Labs: ordered.   Symptoms sound like vasovagal syncope.  EKG without any significant arrhythmia.  Cardiac monitoring is ongoing.  CBC no leukocytosis hemoglobin is normal.  Blood sugar was good at 136.  Rest of labs still pending.  We will do cardiac monitoring we will wait for labs to be back if all negative then most likely vasovagal syncope.  Patient's lab work-up pregnancy test negative complete metabolic panel normal CBC no leukocytosis no anemia blood sugars are good as stated above.  Patient cardiac monitoring without arrhythmia.  Patient's remained stable.  Suspect vasovagal syncope.  Stable for discharge home.   Final Clinical Impression(s) / ED Diagnoses Final diagnoses:  Vasovagal syncope    Rx / DC Orders ED Discharge Orders     None          Vanetta Mulders, MD 01/04/22 8850    Vanetta Mulders, MD 01/04/22 1315

## 2022-01-04 NOTE — ED Notes (Signed)
Up to b/r, steady gait, NAD, calm, interactive.

## 2022-01-04 NOTE — ED Notes (Signed)
Pt alert, NAD, calm, interactive, resps e/u, talking on phone, aware of need for UA sample. VSS.

## 2022-02-23 DIAGNOSIS — E162 Hypoglycemia, unspecified: Secondary | ICD-10-CM | POA: Diagnosis not present

## 2022-02-23 DIAGNOSIS — B3749 Other urogenital candidiasis: Secondary | ICD-10-CM | POA: Diagnosis not present

## 2022-02-23 DIAGNOSIS — Z6830 Body mass index (BMI) 30.0-30.9, adult: Secondary | ICD-10-CM | POA: Diagnosis not present

## 2022-02-27 DIAGNOSIS — E782 Mixed hyperlipidemia: Secondary | ICD-10-CM | POA: Diagnosis not present

## 2022-03-15 DIAGNOSIS — N898 Other specified noninflammatory disorders of vagina: Secondary | ICD-10-CM | POA: Diagnosis not present

## 2022-04-24 DIAGNOSIS — Z01419 Encounter for gynecological examination (general) (routine) without abnormal findings: Secondary | ICD-10-CM | POA: Diagnosis not present

## 2022-06-22 DIAGNOSIS — E162 Hypoglycemia, unspecified: Secondary | ICD-10-CM | POA: Diagnosis not present

## 2022-06-22 DIAGNOSIS — B3749 Other urogenital candidiasis: Secondary | ICD-10-CM | POA: Diagnosis not present

## 2022-06-22 DIAGNOSIS — Z0000 Encounter for general adult medical examination without abnormal findings: Secondary | ICD-10-CM | POA: Diagnosis not present

## 2022-06-22 DIAGNOSIS — Z6829 Body mass index (BMI) 29.0-29.9, adult: Secondary | ICD-10-CM | POA: Diagnosis not present

## 2023-05-29 DIAGNOSIS — Z6830 Body mass index (BMI) 30.0-30.9, adult: Secondary | ICD-10-CM | POA: Diagnosis not present

## 2023-05-29 DIAGNOSIS — J101 Influenza due to other identified influenza virus with other respiratory manifestations: Secondary | ICD-10-CM | POA: Diagnosis not present

## 2023-05-29 DIAGNOSIS — R030 Elevated blood-pressure reading, without diagnosis of hypertension: Secondary | ICD-10-CM | POA: Diagnosis not present

## 2023-05-29 DIAGNOSIS — E669 Obesity, unspecified: Secondary | ICD-10-CM | POA: Diagnosis not present

## 2023-11-07 ENCOUNTER — Emergency Department (HOSPITAL_COMMUNITY)
Admission: EM | Admit: 2023-11-07 | Discharge: 2023-11-07 | Disposition: A | Discharge status: Home / Self Care | Attending: Emergency Medicine | Admitting: Emergency Medicine

## 2023-11-07 ENCOUNTER — Encounter (HOSPITAL_COMMUNITY): Payer: Self-pay | Admitting: Emergency Medicine

## 2023-11-07 ENCOUNTER — Other Ambulatory Visit: Payer: Self-pay

## 2023-11-07 DIAGNOSIS — R1110 Vomiting, unspecified: Secondary | ICD-10-CM | POA: Diagnosis not present

## 2023-11-07 DIAGNOSIS — B349 Viral infection, unspecified: Secondary | ICD-10-CM | POA: Insufficient documentation

## 2023-11-07 LAB — COMPREHENSIVE METABOLIC PANEL WITH GFR
ALT: 13 U/L (ref 0–44)
AST: 17 U/L (ref 15–41)
Albumin: 4.3 g/dL (ref 3.5–5.0)
Alkaline Phosphatase: 56 U/L (ref 38–126)
Anion gap: 10 (ref 5–15)
BUN: 6 mg/dL (ref 6–20)
CO2: 22 mmol/L (ref 22–32)
Calcium: 9.4 mg/dL (ref 8.9–10.3)
Chloride: 104 mmol/L (ref 98–111)
Creatinine, Ser: 0.73 mg/dL (ref 0.44–1.00)
GFR, Estimated: >60 mL/min (ref >60)
Glucose, Bld: 93 mg/dL (ref 70–99)
Potassium: 3.7 mmol/L (ref 3.5–5.1)
Sodium: 136 mmol/L (ref 135–145)
Total Bilirubin: 0.7 mg/dL (ref 0.0–1.2)
Total Protein: 7.8 g/dL (ref 6.5–8.1)

## 2023-11-07 LAB — URINALYSIS, ROUTINE W REFLEX MICROSCOPIC
Bacteria, UA: NONE SEEN
Bilirubin Urine: NEGATIVE
Glucose, UA: NEGATIVE mg/dL
Ketones, ur: NEGATIVE mg/dL
Leukocytes,Ua: NEGATIVE
Nitrite: NEGATIVE
Protein, ur: NEGATIVE mg/dL
Specific Gravity, Urine: 1.016 (ref 1.005–1.030)
pH: 6 (ref 5.0–8.0)

## 2023-11-07 LAB — CBC WITH DIFFERENTIAL/PLATELET
Abs Immature Granulocytes: 0 K/uL (ref 0.00–0.07)
Basophils Absolute: 0 K/uL (ref 0.0–0.1)
Basophils Relative: 0 %
Eosinophils Absolute: 0.1 K/uL (ref 0.0–0.5)
Eosinophils Relative: 2 %
HCT: 46.1 % — ABNORMAL HIGH (ref 36.0–46.0)
Hemoglobin: 14.2 g/dL (ref 12.0–15.0)
Immature Granulocytes: 0 %
Lymphocytes Relative: 44 %
Lymphs Abs: 2.1 K/uL (ref 0.7–4.0)
MCH: 21.9 pg — ABNORMAL LOW (ref 26.0–34.0)
MCHC: 30.8 g/dL (ref 30.0–36.0)
MCV: 71 fL — ABNORMAL LOW (ref 80.0–100.0)
Monocytes Absolute: 0.2 K/uL (ref 0.1–1.0)
Monocytes Relative: 4 %
Neutro Abs: 2.4 K/uL (ref 1.7–7.7)
Neutrophils Relative %: 50 %
Platelets: 275 K/uL (ref 150–400)
RBC: 6.49 MIL/uL — ABNORMAL HIGH (ref 3.87–5.11)
RDW: 14.6 % (ref 11.5–15.5)
WBC: 4.8 K/uL (ref 4.0–10.5)
nRBC: 0 % (ref 0.0–0.2)

## 2023-11-07 LAB — RESP PANEL BY RT-PCR (RSV, FLU A&B, COVID)  RVPGX2
Influenza A by PCR: NEGATIVE
Influenza B by PCR: NEGATIVE
Resp Syncytial Virus by PCR: NEGATIVE
SARS Coronavirus 2 by RT PCR: NEGATIVE

## 2023-11-07 LAB — PREGNANCY, URINE: Preg Test, Ur: NEGATIVE

## 2023-11-07 LAB — LIPASE, BLOOD: Lipase: 29 U/L (ref 11–51)

## 2023-11-07 MED ORDER — ONDANSETRON HCL 4 MG/2ML IJ SOLN
4.0000 mg | Freq: Once | INTRAMUSCULAR | Status: AC
  Administered 2023-11-07: 4 mg via INTRAVENOUS
  Filled 2023-11-07: qty 2

## 2023-11-07 MED ORDER — ONDANSETRON 4 MG PO TBDP: 4.0000 mg | ORAL_TABLET | Freq: Three times a day (TID) | ORAL | 0 refills | Status: DC | PRN

## 2023-11-07 MED ORDER — SODIUM CHLORIDE 0.9 % IV BOLUS
1000.0000 mL | Freq: Once | INTRAVENOUS | Status: AC
  Administered 2023-11-07: 1000 mL via INTRAVENOUS

## 2023-11-07 MED ORDER — DICYCLOMINE HCL 20 MG PO TABS: 20.0000 mg | ORAL_TABLET | Freq: Two times a day (BID) | ORAL | 0 refills | Status: AC

## 2023-11-07 NOTE — Discharge Instructions (Signed)
 Please follow-up closely with your primary care doctor on an outpatient basis.  Return to emergency department immediately for any new or worsening symptoms.

## 2023-11-07 NOTE — ED Provider Notes (Signed)
 [place] EMERGENCY DEPARTMENT AT [facility] Provider Note   CSN: [id number] Arrival date & time: 11/07/23  1026     Patient presents with: Generalized Body Aches   Sydney Chen is a 38 y.o. female.   Patient is a 38 year old female who presents to the emergency department the chief complaint of bodyaches, nausea, vomiting, diarrhea, abdominal cramping which has been ongoing since yesterday.  Patient notes that she did take a home COVID and flu test yesterday which was negative.  She denies any other known sick contacts.  She denies any shortness of breath, chest pain, cough, congestion, otorrhea, sore throat.  She denies any associated fevers.        Prior to Admission medications   Medication Sig Start Date End Date Taking? Authorizing Provider  chlorhexidine  (HIBICLENS ) 4 % external liquid Apply topically daily as needed. Patient not taking: Reported on 01/04/2022 12/14/21   Stuart Vernell Norris, PA-C  doxycycline  (VIBRA -TABS) 100 MG tablet Take 1 tablet (100 mg total) by mouth 2 (two) times daily. Patient not taking: Reported on 01/04/2022 11/04/21   Leath-Warren, Etta PARAS, NP  HYDROcodone -acetaminophen  (NORCO/VICODIN) 5-325 MG per tablet Take 1 tablet by mouth every 4 (four) hours as needed. Patient not taking: Reported on 01/04/2022 10/12/13   Idol, Julie, PA-C  ibuprofen (ADVIL,MOTRIN) 200 MG tablet Take 400 mg by mouth once as needed for mild pain or moderate pain. Patient not taking: Reported on 01/04/2022    [provider]    Allergies: Patient has no known allergies.    Review of Systems  Gastrointestinal:  Positive for diarrhea, nausea and vomiting.  All other systems reviewed and are negative.   Updated Vital Signs BP (!) 131/107 (BP Location: Right Arm)   Pulse 75   Temp 97.8 F (36.6 C)   Resp 17   Ht 5' 6 (1.676 m)   Wt 83.9 kg   SpO2 100%   BMI 29.86 kg/m   Physical Exam Vitals and nursing note reviewed.  Constitutional:       Appearance: Normal appearance.  HENT:     Head: Normocephalic and atraumatic.     Nose: Nose normal.     Mouth/Throat:     Mouth: Mucous membranes are moist.  Eyes:     Extraocular Movements: Extraocular movements intact.     Conjunctiva/sclera: Conjunctivae normal.     Pupils: Pupils are equal, round, and reactive to light.  Cardiovascular:     Rate and Rhythm: Normal rate and regular rhythm.     Pulses: Normal pulses.     Heart sounds: Normal heart sounds. No murmur heard.    No gallop.  Pulmonary:     Effort: Pulmonary effort is normal. No respiratory distress.     Breath sounds: Normal breath sounds. No stridor. No wheezing, rhonchi or rales.  Abdominal:     General: Abdomen is flat. Bowel sounds are normal. There is no distension.     Palpations: Abdomen is soft.     Tenderness: There is no abdominal tenderness. There is no guarding.  Musculoskeletal:        General: Normal range of motion.     Cervical back: Normal range of motion and neck supple. No rigidity or tenderness.  Skin:    General: Skin is warm and dry.  Neurological:     General: No focal deficit present.     Mental Status: She is alert and oriented to person, place, and time. Mental status is at baseline.  Psychiatric:        Mood and Affect: Mood normal.        Behavior: Behavior normal.        Thought Content: Thought content normal.        Judgment: Judgment normal.     (all labs ordered are listed, but only abnormal results are displayed) Labs Reviewed  RESP PANEL BY RT-PCR (RSV, FLU A&B, COVID)  RVPGX2  CBC WITH DIFFERENTIAL/PLATELET  COMPREHENSIVE METABOLIC PANEL WITH GFR  LIPASE, BLOOD  URINALYSIS, ROUTINE W REFLEX MICROSCOPIC  PREGNANCY, URINE    EKG: None  Radiology: No results found.   Procedures   Medications Ordered in the ED  sodium chloride  0.9 % bolus 1,000 mL (has no administration in time range)  ondansetron  (ZOFRAN ) injection 4 mg (has no administration in time  range)                                    Medical Decision Making Amount and/or Complexity of Data Reviewed Labs: ordered.  Risk Prescription drug management.   This patient presents to the ED for concern of nausea, vomiting, diarrhea, body aches differential diagnosis includes acute viral syndrome, pneumonia, gastroenteritis, electrolyte derangement, acute kidney injury    Additional history obtained:  Additional history obtained from none External records from outside source obtained and reviewed including none   Lab Tests:  I Ordered, and personally interpreted labs.  The pertinent results include: No leukocytosis, no anemia, normal kidney function liver function, normal electrolytes, negative lipase, unremarkable urinalysis, negative viral swab    Medicines ordered and prescription drug management:  I ordered medication including IV fluids and Zofran  for nausea, vomiting, diarrhea Reevaluation of the patient after these medicines showed that the patient improved I have reviewed the patients home medicines and have made adjustments as needed   Problem List / ED Course:  Patient is doing well at this time and is stable for discharge home.  Discussed with patient that all workup in Emergency Department has been unremarkable.  Suspect an acute viral gastroenteritis at this point.  Patient's abdominal exam is benign with no focal tenderness throughout.  She has stable vital signs with no indication for sepsis.  The need for close follow-up with her primary care doctor on an outpatient basis was discussed.  Strict return precautions were provided for any new or worsening symptoms.  Will provide symptomatic treatment on outpatient basis.  Do not suspect an acute intra-abdominal surgical process at this point.  Patient voiced understanding to the plan and had no additional questions.   Social Determinants of Health:  None        Final diagnoses:  None    ED Discharge  Orders     None          Daralene Lonni JONETTA DEVONNA 11/07/23 1259    Suzette Pac, MD 11/11/23 1141

## 2023-11-07 NOTE — ED Triage Notes (Signed)
 Body aches that started yesterday, then started having vomiting and diarrhea.  States she had covid and flu test done yesterday that was negative.

## 2024-01-29 ENCOUNTER — Emergency Department (HOSPITAL_COMMUNITY)
Admission: EM | Admit: 2024-01-29 | Discharge: 2024-01-29 | Disposition: A | Discharge status: Home / Self Care | Attending: Emergency Medicine | Admitting: Emergency Medicine

## 2024-01-29 ENCOUNTER — Emergency Department (HOSPITAL_COMMUNITY)

## 2024-01-29 ENCOUNTER — Encounter (HOSPITAL_COMMUNITY): Payer: Self-pay

## 2024-01-29 ENCOUNTER — Other Ambulatory Visit: Payer: Self-pay

## 2024-01-29 DIAGNOSIS — K5790 Diverticulosis of intestine, part unspecified, without perforation or abscess without bleeding: Secondary | ICD-10-CM | POA: Diagnosis not present

## 2024-01-29 DIAGNOSIS — E876 Hypokalemia: Secondary | ICD-10-CM | POA: Insufficient documentation

## 2024-01-29 DIAGNOSIS — D72829 Elevated white blood cell count, unspecified: Secondary | ICD-10-CM | POA: Diagnosis not present

## 2024-01-29 DIAGNOSIS — R1084 Generalized abdominal pain: Secondary | ICD-10-CM

## 2024-01-29 DIAGNOSIS — R112 Nausea with vomiting, unspecified: Secondary | ICD-10-CM | POA: Diagnosis not present

## 2024-01-29 DIAGNOSIS — K5730 Diverticulosis of large intestine without perforation or abscess without bleeding: Secondary | ICD-10-CM | POA: Insufficient documentation

## 2024-01-29 DIAGNOSIS — R911 Solitary pulmonary nodule: Secondary | ICD-10-CM | POA: Insufficient documentation

## 2024-01-29 LAB — URINALYSIS, ROUTINE W REFLEX MICROSCOPIC
Bacteria, UA: NONE SEEN
Bilirubin Urine: NEGATIVE
Glucose, UA: NEGATIVE mg/dL
Hgb urine dipstick: NEGATIVE
Ketones, ur: 5 mg/dL — AB
Nitrite: NEGATIVE
Protein, ur: 30 mg/dL — AB
Specific Gravity, Urine: 1.027 (ref 1.005–1.030)
pH: 5 (ref 5.0–8.0)

## 2024-01-29 LAB — COMPREHENSIVE METABOLIC PANEL WITH GFR
ALT: 11 U/L (ref 0–44)
AST: 17 U/L (ref 15–41)
Albumin: 4.4 g/dL (ref 3.5–5.0)
Alkaline Phosphatase: 60 U/L (ref 38–126)
Anion gap: 11 (ref 5–15)
BUN: 5 mg/dL — ABNORMAL LOW (ref 6–20)
CO2: 25 mmol/L (ref 22–32)
Calcium: 10.3 mg/dL (ref 8.9–10.3)
Chloride: 100 mmol/L (ref 98–111)
Creatinine, Ser: 0.79 mg/dL (ref 0.44–1.00)
GFR, Estimated: >60 mL/min (ref >60)
Glucose, Bld: 95 mg/dL (ref 70–99)
Potassium: 3.3 mmol/L — ABNORMAL LOW (ref 3.5–5.1)
Sodium: 136 mmol/L (ref 135–145)
Total Bilirubin: 0.5 mg/dL (ref 0.0–1.2)
Total Protein: 7.4 g/dL (ref 6.5–8.1)

## 2024-01-29 LAB — CBC
HCT: 41.2 % (ref 36.0–46.0)
Hemoglobin: 12.8 g/dL (ref 12.0–15.0)
MCH: 22 pg — ABNORMAL LOW (ref 26.0–34.0)
MCHC: 31.1 g/dL (ref 30.0–36.0)
MCV: 70.7 fL — ABNORMAL LOW (ref 80.0–100.0)
Platelets: 337 K/uL (ref 150–400)
RBC: 5.83 MIL/uL — ABNORMAL HIGH (ref 3.87–5.11)
RDW: 14.7 % (ref 11.5–15.5)
WBC: 10.9 K/uL — ABNORMAL HIGH (ref 4.0–10.5)
nRBC: 0 % (ref 0.0–0.2)

## 2024-01-29 LAB — PREGNANCY, URINE: Preg Test, Ur: NEGATIVE

## 2024-01-29 LAB — LIPASE, BLOOD: Lipase: 15 U/L (ref 11–51)

## 2024-01-29 MED ORDER — ONDANSETRON 4 MG PO TBDP: 4.0000 mg | ORAL_TABLET | Freq: Three times a day (TID) | ORAL | 0 refills | Status: AC | PRN

## 2024-01-29 MED ORDER — SODIUM CHLORIDE 0.9 % IV BOLUS
1000.0000 mL | Freq: Once | INTRAVENOUS | Status: AC
  Administered 2024-01-29: 1000 mL via INTRAVENOUS

## 2024-01-29 MED ORDER — IOHEXOL 300 MG/ML  SOLN
100.0000 mL | Freq: Once | INTRAMUSCULAR | Status: AC | PRN
  Administered 2024-01-29: 100 mL via INTRAVENOUS

## 2024-01-29 MED ORDER — ONDANSETRON HCL 4 MG/2ML IJ SOLN
4.0000 mg | Freq: Once | INTRAMUSCULAR | Status: AC
  Administered 2024-01-29: 4 mg via INTRAVENOUS
  Filled 2024-01-29: qty 2

## 2024-01-29 MED ORDER — POTASSIUM CHLORIDE CRYS ER 20 MEQ PO TBCR
40.0000 meq | EXTENDED_RELEASE_TABLET | Freq: Once | ORAL | Status: AC
  Administered 2024-01-29: 40 meq via ORAL
  Filled 2024-01-29: qty 2

## 2024-01-29 NOTE — ED Notes (Signed)
 Receive report from JINNY Benders RN. Pt just returned from CT. Nad. Denies pain/nausea or dizziness.ambulated to bathroom.

## 2024-01-29 NOTE — Discharge Instructions (Signed)
 Please follow-up closely with your primary care doctor regarding the pulmonary nodule for repeat imaging.  Follow-up with gastroenterology regarding the diverticulosis.  Please follow the diet as discussed at the bedside.  Return to emergency department immediately for any new or worsening symptoms.

## 2024-01-29 NOTE — ED Provider Notes (Signed)
 [facility] AT [facility] Provider Note   CSN: [id number] Arrival date & time: 01/29/24  1043     Patient presents with: Abdominal Pain and Emesis   Sydney Chen is a 38 y.o. female.   Patient is a 38 year old female who presents to the emergency department with a chief complaint of generalized abdominal pain, nausea, vomiting which has been ongoing since yesterday.  She notes that she has vomited approximately 7 times.  She denies any associated diarrhea or constipation.  She has had no dysuria or hematuria.  She notes that her another coworker did eat the same food as she did yesterday and notes that she was vomiting last night as well.  She denies any associated fever or chills.   Abdominal Pain Associated symptoms: vomiting   Emesis Associated symptoms: abdominal pain        Prior to Admission medications   Medication Sig Start Date End Date Taking? Authorizing Provider  chlorhexidine  (HIBICLENS ) 4 % external liquid Apply topically daily as needed. Patient not taking: Reported on 01/04/2022 12/14/21   Stuart Vernell Norris, PA-C  dicyclomine  (BENTYL ) 20 MG tablet Take 1 tablet (20 mg total) by mouth 2 (two) times daily. 11/07/23   Daralene Lonni BIRCH, PA-C  doxycycline  (VIBRA -TABS) 100 MG tablet Take 1 tablet (100 mg total) by mouth 2 (two) times daily. Patient not taking: Reported on 01/04/2022 11/04/21   Leath-Warren, Etta PARAS, NP  HYDROcodone -acetaminophen  (NORCO/VICODIN) 5-325 MG per tablet Take 1 tablet by mouth every 4 (four) hours as needed. Patient not taking: Reported on 01/04/2022 10/12/13   Idol, Julie, PA-C  ibuprofen (ADVIL,MOTRIN) 200 MG tablet Take 400 mg by mouth once as needed for mild pain or moderate pain. Patient not taking: Reported on 01/04/2022    [provider]  ondansetron  (ZOFRAN -ODT) 4 MG disintegrating tablet Take 1 tablet (4 mg total) by mouth every 8 (eight) hours as needed for nausea or vomiting.  11/07/23   Daralene Lonni BIRCH, PA-C    Allergies: Patient has no known allergies.    Review of Systems  Gastrointestinal:  Positive for abdominal pain and vomiting.  All other systems reviewed and are negative.   Updated Vital Signs BP (!) 162/103 (BP Location: Right Arm)   Pulse 72   Temp 98.5 F (36.9 C) (Oral)   Resp 18   Ht 5' 6 (1.676 m)   Wt 83 kg   SpO2 100%   BMI 29.54 kg/m   Physical Exam Vitals and nursing note reviewed.  Constitutional:      General: She is not in acute distress.    Appearance: Normal appearance. She is not ill-appearing.  HENT:     Head: Normocephalic and atraumatic.     Nose: Nose normal.     Mouth/Throat:     Mouth: Mucous membranes are moist.  Eyes:     Extraocular Movements: Extraocular movements intact.     Conjunctiva/sclera: Conjunctivae normal.     Pupils: Pupils are equal, round, and reactive to light.  Cardiovascular:     Rate and Rhythm: Normal rate and regular rhythm.     Pulses: Normal pulses.     Heart sounds: Normal heart sounds. No murmur heard.    No gallop.  Pulmonary:     Effort: Pulmonary effort is normal. No respiratory distress.     Breath sounds: Normal breath sounds. No stridor. No wheezing, rhonchi or rales.  Abdominal:     General: Abdomen is flat. Bowel sounds are  normal. There is no distension. There are no signs of injury.     Palpations: Abdomen is soft.     Tenderness: There is abdominal tenderness in the right lower quadrant, suprapubic area and left lower quadrant.     Hernia: No hernia is present.  Musculoskeletal:        General: Normal range of motion.     Cervical back: Normal range of motion and neck supple.  Skin:    General: Skin is warm and dry.  Neurological:     General: No focal deficit present.     Mental Status: She is alert and oriented to person, place, and time. Mental status is at baseline.  Psychiatric:        Mood and Affect: Mood normal.        Behavior: Behavior normal.         Thought Content: Thought content normal.        Judgment: Judgment normal.     (all labs ordered are listed, but only abnormal results are displayed) Labs Reviewed  COMPREHENSIVE METABOLIC PANEL WITH GFR - Abnormal; Notable for the following components:      Result Value   Potassium 3.3 (*)    BUN 5 (*)    All other components within normal limits  CBC - Abnormal; Notable for the following components:   WBC 10.9 (*)    RBC 5.83 (*)    MCV 70.7 (*)    MCH 22.0 (*)    All other components within normal limits  URINALYSIS, ROUTINE W REFLEX MICROSCOPIC - Abnormal; Notable for the following components:   APPearance HAZY (*)    Ketones, ur 5 (*)    Protein, ur 30 (*)    Leukocytes,Ua TRACE (*)    All other components within normal limits  LIPASE, BLOOD  PREGNANCY, URINE  POC URINE PREG, ED    EKG: None  Radiology: No results found.   Procedures   Medications Ordered in the ED  sodium chloride  0.9 % bolus 1,000 mL (has no administration in time range)  ondansetron  (ZOFRAN ) injection 4 mg (has no administration in time range)                                    Medical Decision Making Amount and/or Complexity of Data Reviewed Labs: ordered. Radiology: ordered.  Risk Prescription drug management.   This patient presents to the ED for concern of abdominal pain, nausea, vomiting differential diagnosis includes gastroenteritis, food poisoning, acute appendicitis, cholecystitis, bowel obstruction, diverticulitis, ovarian torsion or cyst, PID, tubo-ovarian abscess, pyelonephritis, kidney stone, pancreatitis, mesenteric ischemia    Additional history obtained:  Additional history obtained from medical records External records from outside source obtained and reviewed including medical records   Lab Tests:  I Ordered, and personally interpreted labs.  The pertinent results include: Mild leukocytosis, no anemia, normal kidney function liver function, mild  hypokalemia, unremarkable urinalysis, negative lipase   Imaging Studies ordered:  I ordered imaging studies including CT scan abdomen and pelvis I independently visualized and interpreted imaging which showed diverticulosis without diverticulitis, left lower lobe pulmonary nodule I agree with the radiologist interpretation   Medicines ordered and prescription drug management:  I ordered medication including IV fluids and Zofran , potassium for nausea, vomiting, abdominal pain, hypokalemia Reevaluation of the patient after these medicines showed that the patient improved I have reviewed the patients home medicines and have made  adjustments as needed   Problem List / ED Course:  Patient is doing much better at this time and is stable for discharge home.  She was made aware of the diverticulosis and need for follow-up with GI.  She was made aware of the pulmonary nodule and the need to follow-up with her PCP for continued monitoring of this.  She is otherwise low risk as she is not a smoker.  Blood work has been overall unremarkable.  Potassium was repleted in the emergency department.  Do suspect food poisoning at this time.  Continue symptomatic treatment outpatient basis was discussed.  Strict turn precautions were provided for any new or worsening symptoms.  Patient voiced understanding and had no additional questions.   Social Determinants of Health:  None        Final diagnoses:  None    ED Discharge Orders     None          Daralene Lonni JONETTA DEVONNA 01/29/24 1645    Dean Clarity, MD 01/30/24 1122

## 2024-01-29 NOTE — ED Notes (Signed)
 ED Provider at bedside.

## 2024-01-29 NOTE — ED Triage Notes (Signed)
 Pt to er, pt states that yesterday at work she started having some abd pain, states that she went home and started vomiting, states that she has vomited about 7 times.  Pt reports similar episodes in the past, but that was coupled with diarrhea.  Denies diarrhea at this time
